# Patient Record
Sex: Female | Born: 1960 | Hispanic: Yes | Marital: Single | Smoking: Never smoker
Health system: Southern US, Community
[De-identification: ages and names within clinical notes are randomized; demographics above are authoritative.]

## PROBLEM LIST (undated history)

## (undated) DIAGNOSIS — E119 Type 2 diabetes mellitus without complications: Secondary | ICD-10-CM

## (undated) DIAGNOSIS — E785 Hyperlipidemia, unspecified: Secondary | ICD-10-CM

## (undated) HISTORY — DX: Hyperlipidemia, unspecified: E78.5

## (undated) HISTORY — DX: Type 2 diabetes mellitus without complications: E11.9

## (undated) HISTORY — PX: CHOLECYSTECTOMY: SHX55

---

## 2013-10-07 HISTORY — PX: ABDOMINAL HYSTERECTOMY: SHX81

## 2020-04-12 ENCOUNTER — Other Ambulatory Visit: Payer: Self-pay

## 2020-04-12 ENCOUNTER — Ambulatory Visit (INDEPENDENT_AMBULATORY_CARE_PROVIDER_SITE_OTHER): Payer: Self-pay | Admitting: Cardiology

## 2020-04-12 ENCOUNTER — Encounter: Payer: Self-pay | Admitting: Cardiology

## 2020-04-12 VITALS — BP 108/60 | HR 64 | Ht 59.0 in | Wt 108.8 lb

## 2020-04-12 DIAGNOSIS — R42 Dizziness and giddiness: Secondary | ICD-10-CM

## 2020-04-12 DIAGNOSIS — E0800 Diabetes mellitus due to underlying condition with hyperosmolarity without nonketotic hyperglycemic-hyperosmolar coma (NKHHC): Secondary | ICD-10-CM

## 2020-04-12 DIAGNOSIS — E785 Hyperlipidemia, unspecified: Secondary | ICD-10-CM

## 2020-04-12 NOTE — Patient Instructions (Signed)
Medication Instructions:  Your physician recommends that you continue on your current medications as directed. Please refer to the Current Medication list given to you today.  *If you need a refill on your cardiac medications before your next appointment, please call your pharmacy*   Lab Work: Your physician recommends that you return for lab work in: This week or next week. You must be fasting for these labs Lipds If you have labs (blood work) drawn today and your tests are completely normal, you will receive your results only by: Marland Kitchen MyChart Message (if you have MyChart) OR . A paper copy in the mail If you have any lab test that is abnormal or we need to change your treatment, we will call you to review the results.   Testing/Procedures: None   Follow-Up: At [facility], you and your health needs are our priority.  As part of our continuing mission to provide you with exceptional heart care, we have created designated Provider Care Teams.  These Care Teams include your primary Cardiologist (physician) and Advanced Practice Providers (APPs -  Physician Assistants and Nurse Practitioners) who all work together to provide you with the care you need, when you need it.  We recommend signing up for the patient portal called "MyChart".  Sign up information is provided on this After Visit Summary.  MyChart is used to connect with patients for Virtual Visits (Telemedicine).  Patients are able to view lab/test results, encounter notes, upcoming appointments, etc.  Non-urgent messages can be sent to your provider as well.   To learn more about what you can do with MyChart, go to [email or web address].    Your next appointment:   6 month(s)  The format for your next appointment:   In Person  Provider:   Thomasene Ripple, DO   Other Instructions

## 2020-04-12 NOTE — Progress Notes (Signed)
Cardiology Office Note:    Date:  04/12/2020   ID:  Lynn Phillips, DOB 1961/06/07, MRN [id number]  PCP:  [place], Sarah T, MD  Cardiologist:  Berniece Salines, DO  Electrophysiologist:  None   Referring MD: [place], Sarah T, MD   " I am doing good"  History of Present Illness:    Lynn Phillips is a 59 y.o. female with a hx of diabetes mellitus type 2 on Metformin, hyperlipidemia with most recent LDL back in May showing greater than 190 she was started on atorvastatin 80 mg and Zetia 10 mg.  After the start of her medication she did not seem to show any improvement in her lipid profile therefore it was recommended that she see cardiology.  In the meantime the patient reports that she has been taking her medication as prescribed.  She also tells me that transiently she has some dizziness which has resolved.  Denies any chest pain, shortness of breath, nausea, vomiting.  Of note the patient speaks primarily Spanish therefore our visit was with a [place] spanish interpreter Lynn Phillips).   Past Medical History:  Diagnosis Date  . Diabetes mellitus without complication ([place])   . Hyperlipidemia     History reviewed. No pertinent surgical history.  Current Medications: Current Meds  Medication Sig  . albuterol (VENTOLIN HFA) 108 (90 Base) MCG/ACT inhaler Inhale 2 puffs into the lungs every 6 (six) hours as needed for wheezing or shortness of breath.  Marland Kitchen atorvastatin (LIPITOR) 80 MG tablet Take 80 mg by mouth daily.  Marland Kitchen ezetimibe (ZETIA) 10 MG tablet Take 10 mg by mouth daily.  . metFORMIN (GLUCOPHAGE) 500 MG tablet Take 500 mg by mouth daily.  . polyethylene glycol powder (GLYCOLAX/MIRALAX) 17 GM/SCOOP powder Take 1 capsule by mouth daily. Take 1 cap mixed in water     Allergies:   Patient has no allergy information on record.   Social History   Socioeconomic History  . Marital status: Unknown    Spouse name: Not on file  . Number of children: Not on file  . Years  of education: Not on file  . Highest education level: Not on file  Occupational History  . Not on file  Tobacco Use  . Smoking status: Never Smoker  . Smokeless tobacco: Never Used  Substance and Sexual Activity  . Alcohol use: Not on file  . Drug use: Not on file  . Sexual activity: Not on file  Other Topics Concern  . Not on file  Social History Narrative  . Not on file   Social Determinants of Health   Financial Resource Strain:   . Difficulty of Paying Living Expenses:   Food Insecurity:   . Worried About Charity fundraiser in the Last Year:   . Arboriculturist in the Last Year:   Transportation Needs:   . Film/video editor (Medical):   Marland Kitchen Lack of Transportation (Non-Medical):   Physical Activity:   . Days of Exercise per Week:   . Minutes of Exercise per Session:   Stress:   . Feeling of Stress :   Social Connections:   . Frequency of Communication with Friends and Family:   . Frequency of Social Gatherings with Friends and Family:   . Attends Religious Services:   . Active Member of Clubs or Organizations:   . Attends Archivist Meetings:   Marland Kitchen Marital Status:      Family History: The patient's family history includes Hypertension  in her brother.  ROS:   Review of Systems  Constitution: Negative for decreased appetite, fever and weight gain.  HENT: Negative for congestion, ear discharge, hoarse voice and sore throat.   Eyes: Negative for discharge, redness, vision loss in right eye and visual halos.  Cardiovascular: Negative for chest pain, dyspnea on exertion, leg swelling, orthopnea and palpitations.  Respiratory: Negative for cough, hemoptysis, shortness of breath and snoring.   Endocrine: Negative for heat intolerance and polyphagia.  Hematologic/Lymphatic: Negative for bleeding problem. Does not bruise/bleed easily.  Skin: Negative for flushing, nail changes, rash and suspicious lesions.  Musculoskeletal: Negative for arthritis, joint pain,  muscle cramps, myalgias, neck pain and stiffness.  Gastrointestinal: Negative for abdominal pain, bowel incontinence, diarrhea and excessive appetite.  Genitourinary: Negative for decreased libido, genital sores and incomplete emptying.  Neurological: Negative for brief paralysis, focal weakness, headaches and loss of balance.  Psychiatric/Behavioral: Negative for altered mental status, depression and suicidal ideas.  Allergic/Immunologic: Negative for HIV exposure and persistent infections.    EKGs/Labs/Other Studies Reviewed:    The following studies were reviewed today:   EKG:  The ekg ordered today demonstrates sinus rhythm, heart rate 64 bpm.   Recent Labs: Done on November 19, 2019 CBC: WBC 7.2, hemoglobin 13.4, hematocrit 41.7, platelet 321 Chemistry: Glucose 114, BUN 11, creatinine 0.64, sodium 144, potassium 4.2, chloride 104, bicarb 25, calcium 9.7, total protein 7.0, albumin 4.3, total globulin 2.7, total bili 0.5, alk phos 106, AST 15, ALT 17  Recent Lipid Panel done on November 19, 2019 Total cholesterol 283, triglyceride 151, HDL 64, LDL 191   Physical Exam:    VS:  BP 108/60   Pulse 64   Ht 4' 11" (1.499 m)   Wt 108 lb 12.8 oz (49.4 kg)   SpO2 96%   BMI 21.97 kg/m     Wt Readings from Last 3 Encounters:  04/12/20 108 lb 12.8 oz (49.4 kg)     GEN: Well nourished, well developed in no acute distress HEENT: Normal NECK: No JVD; No carotid bruits LYMPHATICS: No lymphadenopathy CARDIAC: S1S2 noted,RRR, no murmurs, rubs, gallops RESPIRATORY:  Clear to auscultation without rales, wheezing or rhonchi  ABDOMEN: Soft, non-tender, non-distended, +bowel sounds, no guarding. EXTREMITIES: No edema, No cyanosis, no clubbing MUSCULOSKELETAL:  No deformity  SKIN: Warm and dry NEUROLOGIC:  Alert and oriented x 3, non-focal PSYCHIATRIC:  Normal affect, good insight  ASSESSMENT:    1. Hyperlipidemia, unspecified hyperlipidemia type   2. Dizziness   3. Diabetes  mellitus due to underlying condition with hyperosmolarity without coma, without long-term current use of insulin (HCC)    PLAN:     I was able to review the patient most recent lab which was on November 19, 2019 which showed LDL of 191, total cholesterol 283, triglyceride 151 HDL 64.  She has been on atorvastatin 80 mg daily along with Zetia 10 mg daily.  At this time would not like to do is repeat a fasting lipid profile with LP(a).  I do think she is going to be a good candidate for PCSK9 inhibitors, but first I like to see her repeat labs.  If she has not improved she will be referred to our lipid clinic for the transition to PCSK9 inhibitors.  She tells me that her dizziness has significantly improved and she has not had any episodes of dizziness over the last 2 months.  Diabetes mellitus she is on Metformin per PCP we will continue this medications.  No signs  or symptoms of angina therefore there is no need to proceed any ischemic evaluation at this time.  All of her questions were answered during this visit with our provided interpreter-she and her daughter did not have any further questions.  The patient is in agreement with the above plan. The patient left the office in stable condition.  The patient will follow up in 6 months or sooner if needed.   Medication Adjustments/Labs and Tests Ordered: Current medicines are reviewed at length with the patient today.  Concerns regarding medicines are outlined above.  Orders Placed This Encounter  Procedures  . Lipid Profile  . Lipoprotein A (LPA)  . EKG 12-Lead   No orders of the defined types were placed in this encounter.   Patient Instructions  Medication Instructions:  Your physician recommends that you continue on your current medications as directed. Please refer to the Current Medication list given to you today.  *If you need a refill on your cardiac medications before your next appointment, please call your pharmacy*   Lab  Work: Your physician recommends that you return for lab work in: This week or next week. You must be fasting for these labs Lipds If you have labs (blood work) drawn today and your tests are completely normal, you will receive your results only by: Marland Kitchen MyChart Message (if you have MyChart) OR . A paper copy in the mail If you have any lab test that is abnormal or we need to change your treatment, we will call you to review the results.   Testing/Procedures: None   Follow-Up: At [facility], you and your health needs are our priority.  As part of our continuing mission to provide you with exceptional heart care, we have created designated Provider Care Teams.  These Care Teams include your primary Cardiologist (physician) and Advanced Practice Providers (APPs -  Physician Assistants and Nurse Practitioners) who all work together to provide you with the care you need, when you need it.  We recommend signing up for the patient portal called "MyChart".  Sign up information is provided on this After Visit Summary.  MyChart is used to connect with patients for Virtual Visits (Telemedicine).  Patients are able to view lab/test results, encounter notes, upcoming appointments, etc.  Non-urgent messages can be sent to your provider as well.   To learn more about what you can do with MyChart, go to [email or web address].    Your next appointment:   6 month(s)  The format for your next appointment:   In Person  Provider:   Berniece Salines, DO   Other Instructions      Adopting a Healthy Lifestyle.  Know what a healthy weight is for you (roughly BMI <25) and aim to maintain this   Aim for 7+ servings of fruits and vegetables daily   65-80+ fluid ounces of water or unsweet tea for healthy kidneys   Limit to max 1 drink of alcohol per day; avoid smoking/tobacco   Limit animal fats in diet for cholesterol and heart health - choose grass fed whenever available   Avoid highly processed  foods, and foods high in saturated/trans fats   Aim for low stress - take time to unwind and care for your mental health   Aim for 150 min of moderate intensity exercise weekly for heart health, and weights twice weekly for bone health   Aim for 7-9 hours of sleep daily   When it comes to diets, agreement about the perfect plan isnt  easy to find, even among the experts. Experts at the [employer] developed an idea known as the Healthy Eating Plate. Just imagine a plate divided into logical, healthy portions.   The emphasis is on diet quality:   Load up on vegetables and fruits - one-half of your plate: Aim for color and variety, and remember that potatoes dont count.   Go for whole grains - one-quarter of your plate: Whole wheat, barley, wheat berries, quinoa, oats, brown rice, and foods made with them. If you want pasta, go with whole wheat pasta.   Protein power - one-quarter of your plate: Fish, chicken, beans, and nuts are all healthy, versatile protein sources. Limit red meat.   The diet, however, does go beyond the plate, offering a few other suggestions.   Use healthy plant oils, such as olive, canola, soy, corn, sunflower and peanut. Check the labels, and avoid partially hydrogenated oil, which have unhealthy trans fats.   If youre thirsty, drink water. Coffee and tea are good in moderation, but skip sugary drinks and limit milk and dairy products to one or two daily servings.   The type of carbohydrate in the diet is more important than the amount. Some sources of carbohydrates, such as vegetables, fruits, whole grains, and beans-are healthier than others.   Finally, stay active  Signed, Berniece Salines, DO  04/12/2020 9:05 AM    [place]

## 2020-04-15 LAB — LIPID PANEL
Chol/HDL Ratio: 4.7 ratio — ABNORMAL HIGH (ref 0.0–4.4)
Cholesterol, Total: 291 mg/dL — ABNORMAL HIGH (ref 100–199)
HDL: 62 mg/dL (ref >39)
LDL Chol Calc (NIH): 201 mg/dL — ABNORMAL HIGH (ref 0–99)
Triglycerides: 152 mg/dL — ABNORMAL HIGH (ref 0–149)
VLDL Cholesterol Cal: 28 mg/dL (ref 5–40)

## 2020-04-15 LAB — LIPOPROTEIN A (LPA): Lipoprotein (a): 193.9 nmol/L — ABNORMAL HIGH (ref <75.0)

## 2020-04-17 ENCOUNTER — Telehealth: Payer: Self-pay

## 2020-04-17 DIAGNOSIS — E785 Hyperlipidemia, unspecified: Secondary | ICD-10-CM

## 2020-04-17 NOTE — Telephone Encounter (Signed)
-----   Message from Thomasene Ripple, DO sent at 04/17/2020 10:07 AM EDT ----- Please let the patient know that her LDL is still highly elevated, LP(a) is 193 which is significantly high.  Please refer the patient to our lipid clinic.  She is a great candidate to be started on PCSK9 inhibitors.

## 2020-04-17 NOTE — Telephone Encounter (Signed)
Spoke with patient regarding results and recommendation. Interpreter used with ID# [id number]  Patient verbalizes understanding and is agreeable to plan of care. Advised patient to call back with any issues or concerns.

## 2020-04-26 ENCOUNTER — Encounter: Payer: Self-pay | Admitting: General Practice

## 2020-10-03 ENCOUNTER — Other Ambulatory Visit: Payer: Self-pay | Admitting: Family Medicine

## 2020-10-03 DIAGNOSIS — Z1231 Encounter for screening mammogram for malignant neoplasm of breast: Secondary | ICD-10-CM

## 2020-11-10 ENCOUNTER — Ambulatory Visit
Admission: RE | Admit: 2020-11-10 | Discharge: 2020-11-10 | Disposition: A | Discharge status: Home / Self Care | Payer: No Typology Code available for payment source | Source: Ambulatory Visit | Attending: Family Medicine | Admitting: Family Medicine

## 2020-11-10 ENCOUNTER — Other Ambulatory Visit: Payer: Self-pay

## 2020-11-10 DIAGNOSIS — Z1231 Encounter for screening mammogram for malignant neoplasm of breast: Secondary | ICD-10-CM

## 2021-09-07 ENCOUNTER — Ambulatory Visit: Payer: No Typology Code available for payment source | Admitting: Cardiology

## 2021-09-17 ENCOUNTER — Ambulatory Visit (INDEPENDENT_AMBULATORY_CARE_PROVIDER_SITE_OTHER): Payer: Self-pay | Admitting: Cardiology

## 2021-09-17 ENCOUNTER — Other Ambulatory Visit: Payer: Self-pay

## 2021-09-17 ENCOUNTER — Encounter: Payer: Self-pay | Admitting: Cardiology

## 2021-09-17 VITALS — BP 104/54 | HR 63 | Ht 59.0 in | Wt 112.6 lb

## 2021-09-17 DIAGNOSIS — Z09 Encounter for follow-up examination after completed treatment for conditions other than malignant neoplasm: Secondary | ICD-10-CM

## 2021-09-17 DIAGNOSIS — E7849 Other hyperlipidemia: Secondary | ICD-10-CM

## 2021-09-17 DIAGNOSIS — E785 Hyperlipidemia, unspecified: Secondary | ICD-10-CM

## 2021-09-17 DIAGNOSIS — Z719 Counseling, unspecified: Secondary | ICD-10-CM

## 2021-09-17 NOTE — Progress Notes (Signed)
Cardiology Office Note:    Date:  09/20/2021   ID:  Lynn Phillips, DOB 09/04/1961, MRN [id number]  PCP:  [place], Sarah T, MD  Cardiologist:  Thomasene Ripple, DO  Electrophysiologist:  None   Referring MD: [place], Sarah T, MD   " I am doing fine"   History of Present Illness:    Lynn Phillips is a 60 y.o. female with a hx of of diabetes mellitus type 2 on Metformin, hyperlipidemia with most recent LDL back in May showing greater than 190 she was started on atorvastatin 80 mg and Zetia 10 mg.  After the start of her medication she did not seem to show any improvement in her lipid profile therefore it was recommended that she see cardiology.  I then saw the patient on April 12, 2020 at that time I kept her on her medication, did repeat labs. Her labs came back elevated so I recommended the patient for PCSK9 inhibitors and refer her to our lipid clinic.  Appears that the patient did not make it to the lipid clinic to continue to take her Lipitor and Zetia.  Is here today for follow-up visit she offers no complaints at this time.  Past Medical History:  Diagnosis Date   Diabetes mellitus without complication (HCC)    Hyperlipidemia     No past surgical history on file.  Current Medications: Current Meds  Medication Sig   atorvastatin (LIPITOR) 80 MG tablet Take 80 mg by mouth daily.   ezetimibe (ZETIA) 10 MG tablet Take 10 mg by mouth daily.   metFORMIN (GLUCOPHAGE) 500 MG tablet Take 500 mg by mouth daily.     Allergies:   Patient has no known allergies.   Social History   Socioeconomic History   Marital status: Unknown    Spouse name: Not on file   Number of children: Not on file   Years of education: Not on file   Highest education level: Not on file  Occupational History   Not on file  Tobacco Use   Smoking status: Never   Smokeless tobacco: Never  Substance and Sexual Activity   Alcohol use: Not on file   Drug use: Not on file   Sexual activity:  Not on file  Other Topics Concern   Not on file  Social History Narrative   Not on file   Social Determinants of Health   Financial Resource Strain: Not on file  Food Insecurity: Not on file  Transportation Needs: Not on file  Physical Activity: Not on file  Stress: Not on file  Social Connections: Not on file     Family History: The patient's family history includes Hypertension in her brother.  ROS:   Review of Systems  Constitution: Negative for decreased appetite, fever and weight gain.  HENT: Negative for congestion, ear discharge, hoarse voice and sore throat.   Eyes: Negative for discharge, redness, vision loss in right eye and visual halos.  Cardiovascular: Negative for chest pain, dyspnea on exertion, leg swelling, orthopnea and palpitations.  Respiratory: Negative for cough, hemoptysis, shortness of breath and snoring.   Endocrine: Negative for heat intolerance and polyphagia.  Hematologic/Lymphatic: Negative for bleeding problem. Does not bruise/bleed easily.  Skin: Negative for flushing, nail changes, rash and suspicious lesions.  Musculoskeletal: Negative for arthritis, joint pain, muscle cramps, myalgias, neck pain and stiffness.  Gastrointestinal: Negative for abdominal pain, bowel incontinence, diarrhea and excessive appetite.  Genitourinary: Negative for decreased libido, genital sores and incomplete emptying.  Neurological: Negative for brief paralysis, focal weakness, headaches and loss of balance.  Psychiatric/Behavioral: Negative for altered mental status, depression and suicidal ideas.  Allergic/Immunologic: Negative for HIV exposure and persistent infections.    EKGs/Labs/Other Studies Reviewed:    The following studies were reviewed today:   EKG: None today  Recent Labs: No results found for requested labs within last 8760 hours.  Recent Lipid Panel    Component Value Date/Time   CHOL 291 (H) 04/13/2020 0909   TRIG 152 (H) 04/13/2020 0909    HDL 62 04/13/2020 0909   CHOLHDL 4.7 (H) 04/13/2020 0909   LDLCALC 201 (H) 04/13/2020 0909    Physical Exam:    VS:  BP (!) 104/54   Pulse 63   Ht 4\' 11"  (1.499 m)   Wt 112 lb 9.6 oz (51.1 kg)   SpO2 97%   BMI 22.74 kg/m     Wt Readings from Last 3 Encounters:  09/17/21 112 lb 9.6 oz (51.1 kg)  04/12/20 108 lb 12.8 oz (49.4 kg)     GEN: Well nourished, well developed in no acute distress HEENT: Normal NECK: No JVD; No carotid bruits LYMPHATICS: No lymphadenopathy CARDIAC: S1S2 noted,RRR, no murmurs, rubs, gallops RESPIRATORY:  Clear to auscultation without rales, wheezing or rhonchi  ABDOMEN: Soft, non-tender, non-distended, +bowel sounds, no guarding. EXTREMITIES: No edema, No cyanosis, no clubbing MUSCULOSKELETAL:  No deformity  SKIN: Warm and dry NEUROLOGIC:  Alert and oriented x 3, non-focal PSYCHIATRIC:  Normal affect, good insight  ASSESSMENT:    1. Familial hyperlipidemia   2. Hyperlipidemia, unspecified hyperlipidemia type   3. Cardiology follow-up encounter   4. Health education/counseling    PLAN:     1.  We will get repeat labs for her lipid profile and LP(a).  I still would like to have the patient see my colleagues in the lipid clinic if this is still elevated.  She is in agreement with this.  Her visit was facilitated by an interpreter.  CV risk counseling and primary prevention:  -recommend heart healthy/Mediterranean diet, with whole grains, fruits, vegetable, fish, lean meats, nuts, and olive oil. Limit salt. -recommend moderate walking, 3-5 times/week for 30-50 minutes each session. Aim for at least 150 minutes.week. Goal should be pace of 3 miles/hours, or walking 1.5 miles in 30 minutes -recommend avoidance of tobacco products. Avoid excess alcohol.  The patient is in agreement with the above plan. The patient left the office in stable condition.  The patient will follow up in   Medication Adjustments/Labs and Tests Ordered: Current medicines  are reviewed at length with the patient today.  Concerns regarding medicines are outlined above.  Orders Placed This Encounter  Procedures   Lipid panel   Lipoprotein A (LPA)   EKG 12-Lead   No orders of the defined types were placed in this encounter.   Patient Instructions  Medication Instructions:  Your physician recommends that you continue on your current medications as directed. Please refer to the Current Medication list given to you today.  *If you need a refill on your cardiac medications before your next appointment, please call your pharmacy*   Lab Work: Your physician recommends that you return for lab work in:  Lipids, Lp(a) If you have labs (blood work) drawn today and your tests are completely normal, you will receive your results only by: MyChart Message (if you have MyChart) OR A paper copy in the mail If you have any lab test that is abnormal or we need  to change your treatment, we will call you to review the results.   Testing/Procedures: None   Follow-Up: At [facility], you and your health needs are our priority.  As part of our continuing mission to provide you with exceptional heart care, we have created designated Provider Care Teams.  These Care Teams include your primary Cardiologist (physician) and Advanced Practice Providers (APPs -  Physician Assistants and Nurse Practitioners) who all work together to provide you with the care you need, when you need it.  We recommend signing up for the patient portal called "MyChart".  Sign up information is provided on this After Visit Summary.  MyChart is used to connect with patients for Virtual Visits (Telemedicine).  Patients are able to view lab/test results, encounter notes, upcoming appointments, etc.  Non-urgent messages can be sent to your provider as well.   To learn more about what you can do with MyChart, go to [email or web address].    Your next appointment:   1 year(s)  The format for your next  appointment:   In Person  Provider:   Thomasene Ripple, DO     Other Instructions     Adopting a Healthy Lifestyle.  Know what a healthy weight is for you (roughly BMI <25) and aim to maintain this   Aim for 7+ servings of fruits and vegetables daily   65-80+ fluid ounces of water or unsweet tea for healthy kidneys   Limit to max 1 drink of alcohol per day; avoid smoking/tobacco   Limit animal fats in diet for cholesterol and heart health - choose grass fed whenever available   Avoid highly processed foods, and foods high in saturated/trans fats   Aim for low stress - take time to unwind and care for your mental health   Aim for 150 min of moderate intensity exercise weekly for heart health, and weights twice weekly for bone health   Aim for 7-9 hours of sleep daily   When it comes to diets, agreement about the perfect plan isnt easy to find, even among the experts. Experts at the [facility] of [employer] developed an idea known as the Healthy Eating Plate. Just imagine a plate divided into logical, healthy portions.   The emphasis is on diet quality:   Load up on vegetables and fruits - one-half of your plate: Aim for color and variety, and remember that potatoes dont count.   Go for whole grains - one-quarter of your plate: Whole wheat, barley, wheat berries, quinoa, oats, brown rice, and foods made with them. If you want pasta, go with whole wheat pasta.   Protein power - one-quarter of your plate: Fish, chicken, beans, and nuts are all healthy, versatile protein sources. Limit red meat.   The diet, however, does go beyond the plate, offering a few other suggestions.   Use healthy plant oils, such as olive, canola, soy, corn, sunflower and peanut. Check the labels, and avoid partially hydrogenated oil, which have unhealthy trans fats.   If youre thirsty, drink water. Coffee and tea are good in moderation, but skip sugary drinks and limit milk and dairy products to  one or two daily servings.   The type of carbohydrate in the diet is more important than the amount. Some sources of carbohydrates, such as vegetables, fruits, whole grains, and beans-are healthier than others.   Finally, stay active  Signed, Thomasene Ripple, DO  09/20/2021 9:21 PM    [facility]

## 2021-09-17 NOTE — Patient Instructions (Signed)
Medication Instructions:  Your physician recommends that you continue on your current medications as directed. Please refer to the Current Medication list given to you today.  *If you need a refill on your cardiac medications before your next appointment, please call your pharmacy*   Lab Work: Your physician recommends that you return for lab work in:  Lipids, Lp(a) If you have labs (blood work) drawn today and your tests are completely normal, you will receive your results only by: MyChart Message (if you have MyChart) OR A paper copy in the mail If you have any lab test that is abnormal or we need to change your treatment, we will call you to review the results.   Testing/Procedures: None   Follow-Up: At [facility], you and your health needs are our priority.  As part of our continuing mission to provide you with exceptional heart care, we have created designated Provider Care Teams.  These Care Teams include your primary Cardiologist (physician) and Advanced Practice Providers (APPs -  Physician Assistants and Nurse Practitioners) who all work together to provide you with the care you need, when you need it.  We recommend signing up for the patient portal called "MyChart".  Sign up information is provided on this After Visit Summary.  MyChart is used to connect with patients for Virtual Visits (Telemedicine).  Patients are able to view lab/test results, encounter notes, upcoming appointments, etc.  Non-urgent messages can be sent to your provider as well.   To learn more about what you can do with MyChart, go to [email or web address].    Your next appointment:   1 year(s)  The format for your next appointment:   In Person  Provider:   Thomasene Ripple, DO     Other Instructions

## 2021-09-20 DIAGNOSIS — E7849 Other hyperlipidemia: Secondary | ICD-10-CM | POA: Insufficient documentation

## 2021-09-20 DIAGNOSIS — E785 Hyperlipidemia, unspecified: Secondary | ICD-10-CM | POA: Insufficient documentation

## 2021-09-20 DIAGNOSIS — Z719 Counseling, unspecified: Secondary | ICD-10-CM | POA: Insufficient documentation

## 2021-09-20 DIAGNOSIS — Z09 Encounter for follow-up examination after completed treatment for conditions other than malignant neoplasm: Secondary | ICD-10-CM | POA: Insufficient documentation

## 2021-12-21 ENCOUNTER — Other Ambulatory Visit: Payer: Self-pay | Admitting: Family Medicine

## 2021-12-21 DIAGNOSIS — Z1231 Encounter for screening mammogram for malignant neoplasm of breast: Secondary | ICD-10-CM

## 2021-12-26 ENCOUNTER — Other Ambulatory Visit: Payer: Self-pay

## 2021-12-26 DIAGNOSIS — Z1231 Encounter for screening mammogram for malignant neoplasm of breast: Secondary | ICD-10-CM

## 2022-02-25 ENCOUNTER — Ambulatory Visit
Admission: RE | Admit: 2022-02-25 | Discharge: 2022-02-25 | Disposition: A | Discharge status: Home / Self Care | Payer: No Typology Code available for payment source | Source: Ambulatory Visit | Attending: Family Medicine | Admitting: Family Medicine

## 2022-02-25 DIAGNOSIS — Z1231 Encounter for screening mammogram for malignant neoplasm of breast: Secondary | ICD-10-CM

## 2023-04-28 IMAGING — MG MM DIGITAL SCREENING BILAT W/ TOMO AND CAD
8 series · 8 of 24 positions shown · non-contrast
Comparison: Previous exam(s).

CLINICAL DATA: Screening.

EXAM:
DIGITAL SCREENING BILATERAL MAMMOGRAM WITH TOMOSYNTHESIS AND CAD
TECHNIQUE: Bilateral screening digital craniocaudal and mediolateral oblique
mammograms were obtained. Bilateral screening digital breast
tomosynthesis was performed. The images were evaluated with
computer-aided detection.

[L MLO synth-2D]
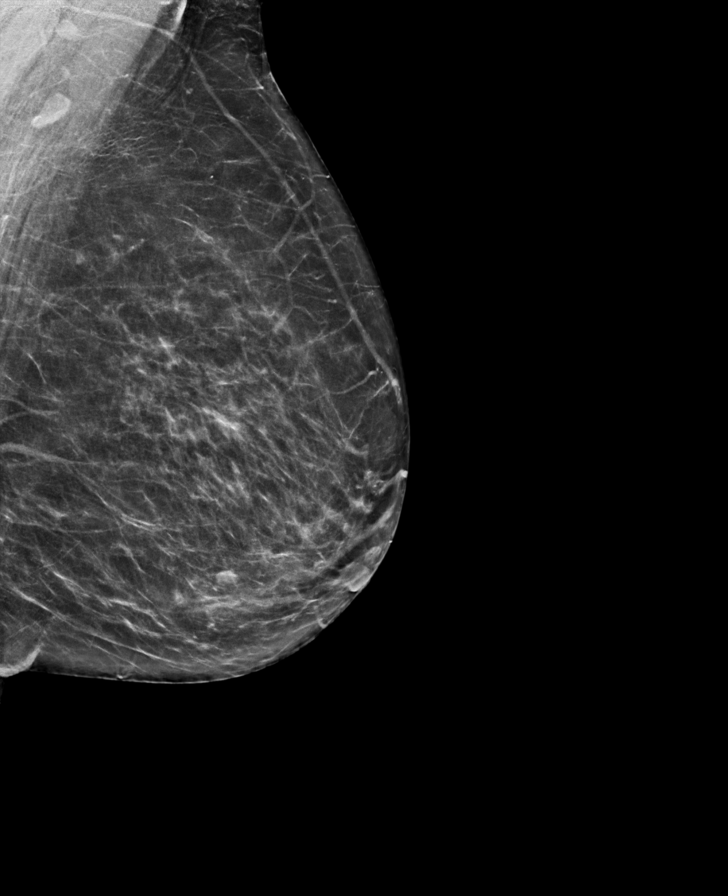

[L CC synth-2D]
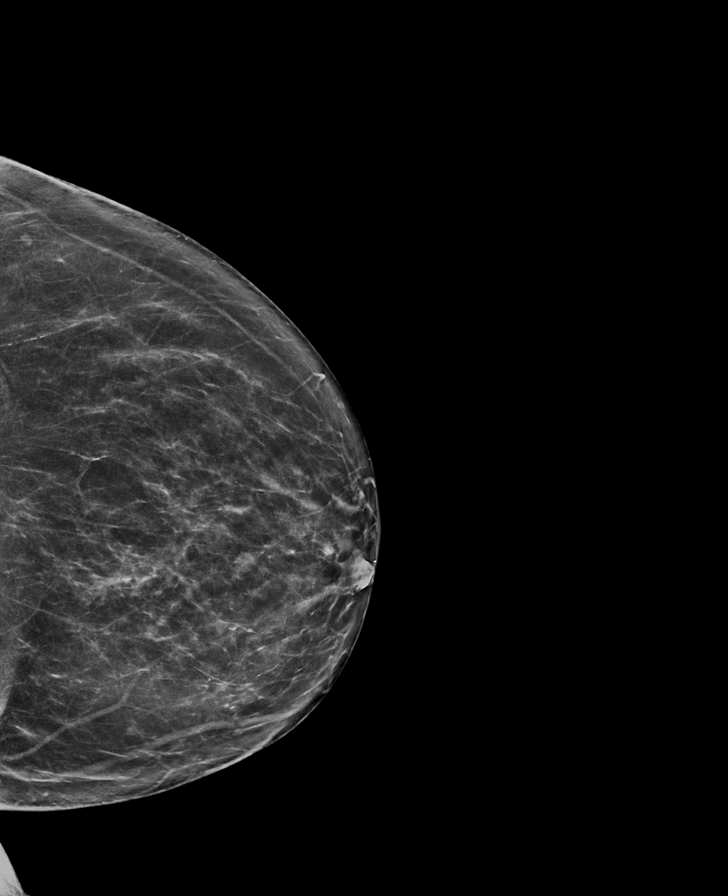

[R MLO synth-2D]
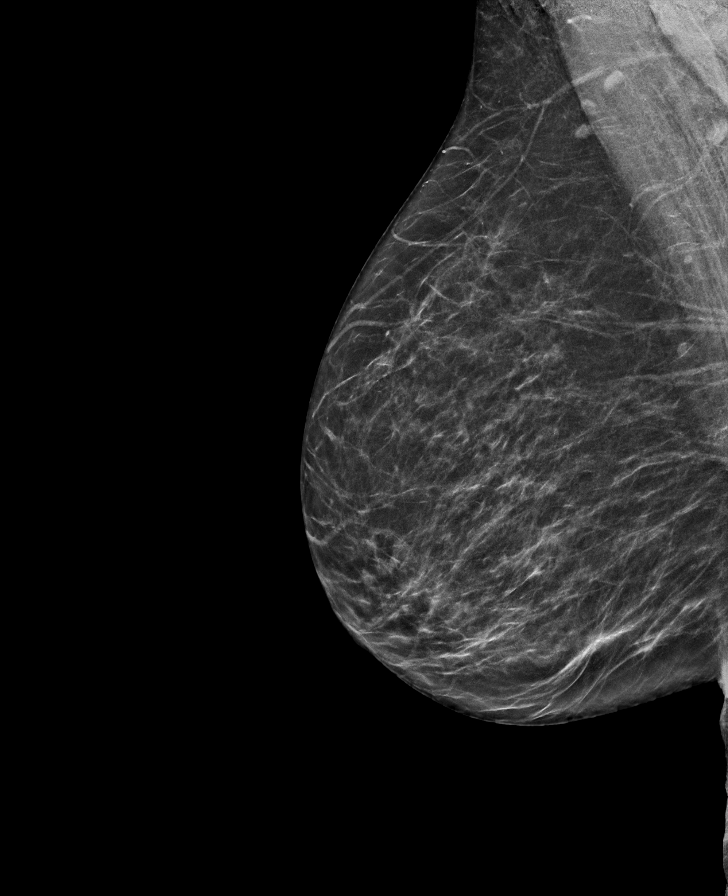

[R CC synth-2D]
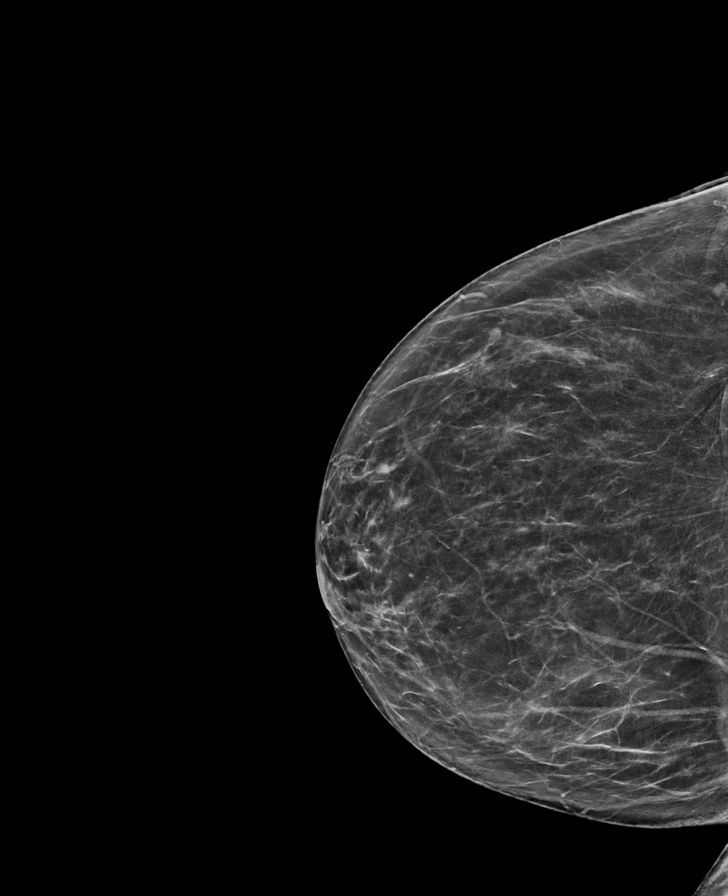

[L MLO tomo · tomo slice 37/74.0]
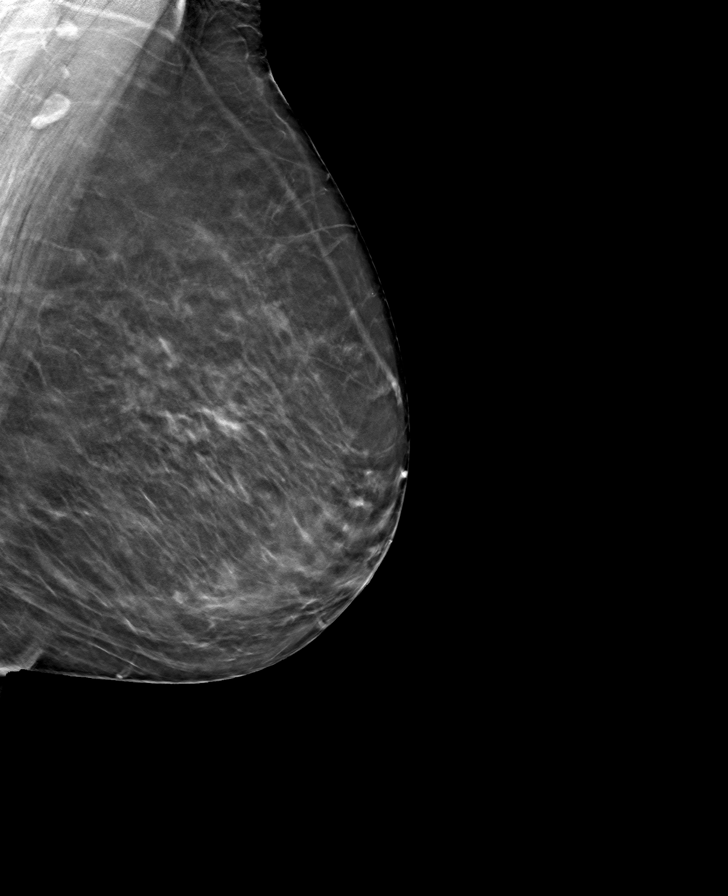

[L CC tomo · tomo slice 37/73.0]
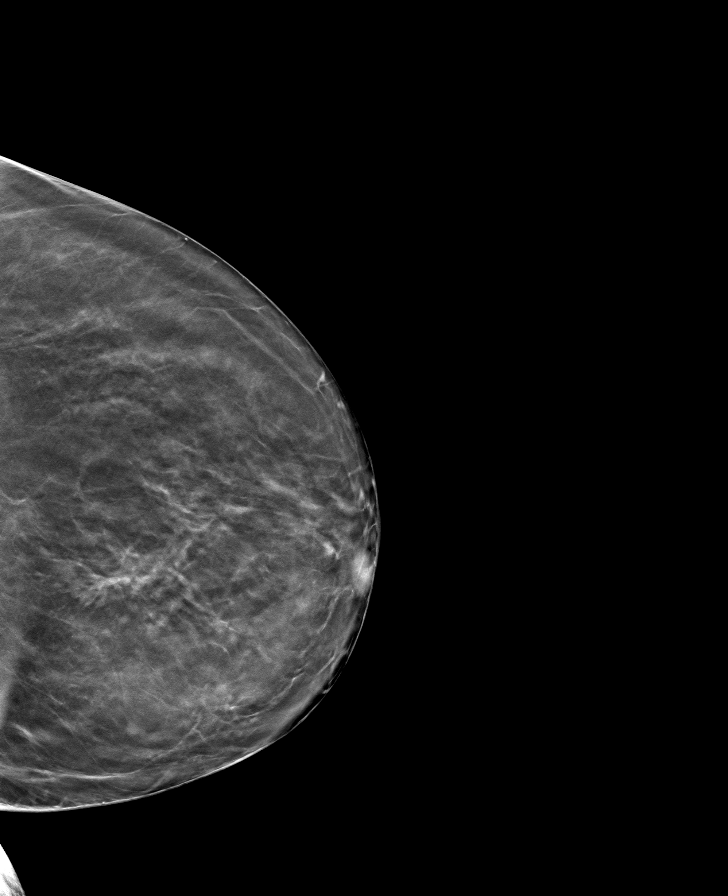

[R CC tomo · tomo slice 35/70.0]
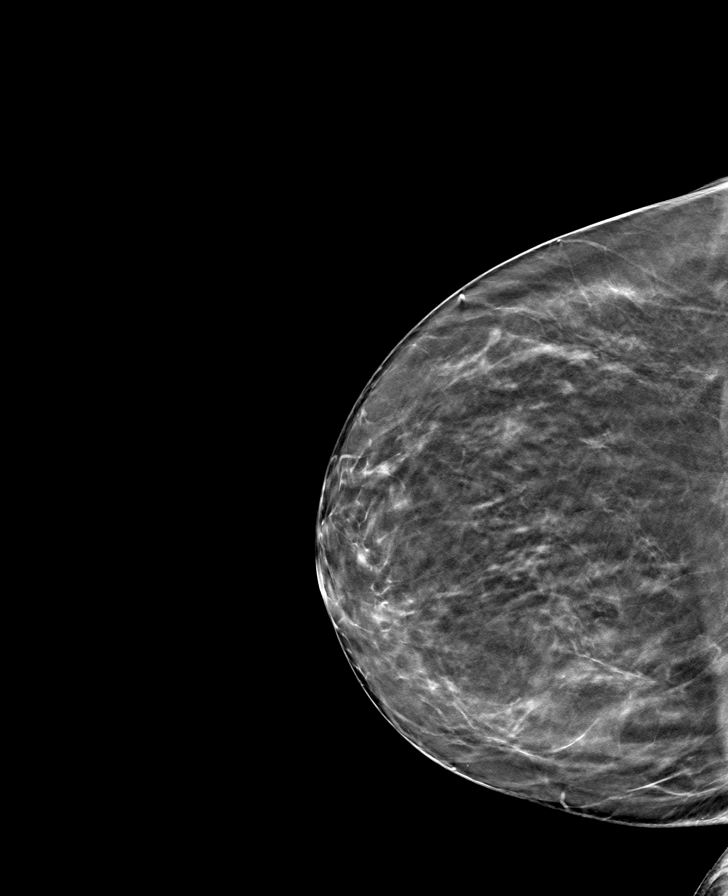

[R MLO tomo · tomo slice 35/70.0]
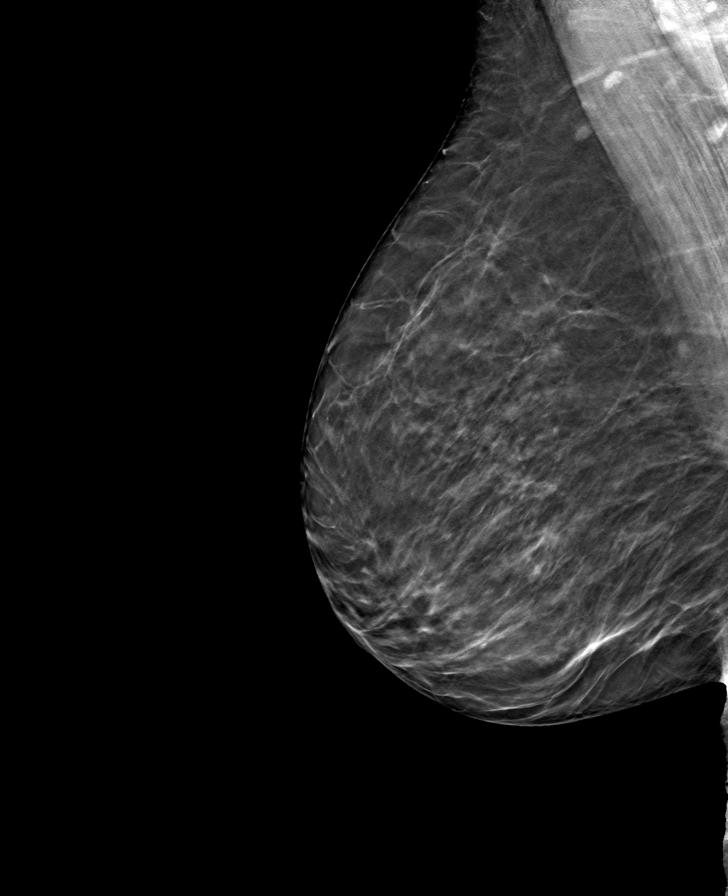

[8 of 24 positions shown; findings below may reference images not displayed]

ACR Breast Density Category b: There are scattered areas of
fibroglandular density.
FINDINGS: There are no findings suspicious for malignancy.
IMPRESSION: No mammographic evidence of malignancy. A result letter of this
screening mammogram will be mailed directly to the patient.

RECOMMENDATION:
Screening mammogram in one year. (Code:51-O-LD2)

BI-RADS CATEGORY  1: Negative.

## 2023-05-09 ENCOUNTER — Encounter: Payer: Self-pay | Admitting: Nurse Practitioner

## 2023-07-24 ENCOUNTER — Other Ambulatory Visit (INDEPENDENT_AMBULATORY_CARE_PROVIDER_SITE_OTHER): Payer: Self-pay

## 2023-07-24 ENCOUNTER — Encounter: Payer: Self-pay | Admitting: Nurse Practitioner

## 2023-07-24 ENCOUNTER — Ambulatory Visit (INDEPENDENT_AMBULATORY_CARE_PROVIDER_SITE_OTHER): Payer: Self-pay | Admitting: Nurse Practitioner

## 2023-07-24 VITALS — BP 104/50 | HR 80 | Ht <= 58 in | Wt 91.1 lb

## 2023-07-24 DIAGNOSIS — Z1211 Encounter for screening for malignant neoplasm of colon: Secondary | ICD-10-CM

## 2023-07-24 DIAGNOSIS — R933 Abnormal findings on diagnostic imaging of other parts of digestive tract: Secondary | ICD-10-CM

## 2023-07-24 DIAGNOSIS — D649 Anemia, unspecified: Secondary | ICD-10-CM

## 2023-07-24 DIAGNOSIS — R7989 Other specified abnormal findings of blood chemistry: Secondary | ICD-10-CM

## 2023-07-24 LAB — CBC WITH DIFFERENTIAL/PLATELET
Basophils Absolute: 0 10*3/uL (ref 0.0–0.1)
Basophils Relative: 0.5 % (ref 0.0–3.0)
Eosinophils Absolute: 0.1 10*3/uL (ref 0.0–0.7)
Eosinophils Relative: 1.3 % (ref 0.0–5.0)
HCT: 40.8 % (ref 36.0–46.0)
Hemoglobin: 12.7 g/dL (ref 12.0–15.0)
Lymphocytes Relative: 44.3 % (ref 12.0–46.0)
Lymphs Abs: 3.2 10*3/uL (ref 0.7–4.0)
MCHC: 31.2 g/dL (ref 30.0–36.0)
MCV: 84.5 fL (ref 78.0–100.0)
Monocytes Absolute: 0.5 10*3/uL (ref 0.1–1.0)
Monocytes Relative: 7.6 % (ref 3.0–12.0)
Neutro Abs: 3.3 10*3/uL (ref 1.4–7.7)
Neutrophils Relative %: 46.3 % (ref 43.0–77.0)
Platelets: 286 10*3/uL (ref 150.0–400.0)
RBC: 4.83 Mil/uL (ref 3.87–5.11)
RDW: 15.3 % (ref 11.5–15.5)
WBC: 7.2 10*3/uL (ref 4.0–10.5)

## 2023-07-24 LAB — HEPATIC FUNCTION PANEL
ALT: 48 U/L — ABNORMAL HIGH (ref 0–35)
AST: 40 U/L — ABNORMAL HIGH (ref 0–37)
Albumin: 4 g/dL (ref 3.5–5.2)
Alkaline Phosphatase: 123 U/L — ABNORMAL HIGH (ref 39–117)
Bilirubin, Direct: 0.1 mg/dL (ref 0.0–0.3)
Total Bilirubin: 0.6 mg/dL (ref 0.2–1.2)
Total Protein: 7.3 g/dL (ref 6.0–8.3)

## 2023-07-24 MED ORDER — NA SULFATE-K SULFATE-MG SULF 17.5-3.13-1.6 GM/177ML PO SOLN: 1.0000 | Freq: Once | ORAL | 0 refills | Status: AC

## 2023-07-24 NOTE — Progress Notes (Addendum)
Brief Narrative 62 y.o. yo female , new to the practice, referred by PCP for enteritis and also for colonoscopy. She has a history of  DM2, HLD, cholecystectomy.  Patient does not speak [place].  Her daughter is here and serves as interpreter  ASSESSMENT    Admission to [facility] in June for epigastric pain ( resolved).   Hospital records not available to me right now.  Patient apparently had a lap cholecystectomy in April 2024. Returned to ED in April with epigastric pain. Told she was constipated and pain did resolve with enemas. However, reportedly  a CT scan showed "enteritis ".     anemia ( per PCP's office note).  Hgb 11.5 in June. Baseline hgb?   No overt GI bleeding.   Elevated LFTs ( June labs).  Alk phos 300, mildly elevated ALT .   Colon cancer screening.   No known FMH of colon cancer. No blood in stool. No bowel changes  See PMH for any additional medical history  ADDENDUM:  I received 3 CT scan reports as below.   In summary, we were awaiting scans due to elevation in liver enzymes and also reported enteritis.   Regarding " enteritis" on 03/13/23 CT scan, this was not reported on follow up scan 03/25/23. Furthermore she has since undergone colonoscopy and the terminal ileum was normal up to 20 cm.    Regarding elevation of liver enzymes in June, that my have been related to cholecystitis as numbers have improved since cholecystectomy. Will discuss with Dr. Myrtie Neither as to whether he wants additional imaging to further characterize the described small liver hypodensities.   Lastly, colon findings on below CT scans were not corroborated by recent colonoscopy   01/26/23 CT scan with contrast done for epigastric pain and elevated LFTs showed mild gallbladder wall thickening with gallbladder wall enhancement.  Possible small bowel wall enhancement involving pelvic bowel wall loops suggesting enteritis.  Scattered subcentimeter hypodensities in the liver too small to  characterize.  These may be cyst or hemangiomas  Patient subsequently underwent a cholecystectomy . Readmitted  June 2024 for epigastric pain.   03/13/23 CT scan with contrast shows scattered low-density lesions in the liver too small to definitively characterize but possibly representing cyst or hemangiomas.  Cholecystectomy.  Mild intrahepatic biliary duct dilation due to reservoir effect.  Common bile duct normal in caliber.  Pancreas unremarkable.  Normal caliber large and small bowel.  Abrupt narrowing of the caliber of the ascending colon with mild wall thickening in this area.  Could be under distention or peristalsis.  Large stool burden in the cecum and fecalization of the terminal ileum suggesting slow transit/partial obstruction.  Mild small bowel wall thickening and hyperenhancement of a loop of small bowel in the right lower quadrant.  The adjacent small bowel is mildly distended with fluid.  Normal appendix.  6 mm RUL nodule, indeterminate  03/25/2023 CT scan with contrast shows no biliary duct dilation.  Hepatic hypodensities stable, too small to characterize.  Stomach is contracted.  Small bowel is normal.  Stable abrupt caliber change in the mid ascending colon with moderate retained stool in the upstream ascending colon and cecum but no more than previously.  Colonic contraction from the caliber change through the hepatic flexure distal transverse segment with underlying colitis not excluded.   PLAN   --Update CBC, obtain ferritin, TIBC --Repeat LFTs.  --Requesting CT scan results which will may help [place] better understand abnormal liver tests and  also " enteritis"  --Will arrange for a screening colonoscopy but patient / daughter understand that depending on CT scan findings and / or today's lab results we may need to add an EGD ( if anemic / iron deficient).    HPI   Chief complaint :  No complaints at present  PCP's last office note is available for review.  Unfortunately I do  not have the CT scan done in June showing concerns for enteritis  " Enteritis"  Per daughter, Alundra had her gallbladder out in April.  She returned to the hospital early June with upper abdominal pain.  Patient told she was constipated and the pain did resolve with enemas.  Apparently CT scan was notable for possible enteritis.  The upper abdominal pain was not associated with any nausea, vomiting or diarrhea.  Her symptoms have totally resolved  Colon cancer screening:  Patient has never had colon cancer screening.  She has no family history of colon cancer as far as she knows.  She reports normal bowel movements.  No blood in stool.  Per daughter patient has had some involuntary weight loss for unclear reasons  Unrelated to this visit  - Labs in June per PCP's  office note remarkable for hemoglobin of 11.6, MCV 83, AST 168, ALT 55, alk phos 247.  Follow-up LFTs 03/21/2023 remarkable for alkaline phosphatase of 300, ALT 69.  LFTs in March 2024 were normal.  HCV antibody negative  Regarding anemia, patient does not take NSAIDs on a regular basis.  She has had no dark stools. I do not know her baseline hgb.   Past Medical History:  Diagnosis Date   Diabetes mellitus without complication (HCC)    Hyperlipidemia    Past Surgical History:  Procedure Laterality Date   ABDOMINAL HYSTERECTOMY  2015   CHOLECYSTECTOMY     Family History  Problem Relation Age of Onset   Cancer Father        type unknown-mets   Hypertension Brother    Diabetes Paternal Aunt    Other Daughter        prediabetes   Social History   Tobacco Use   Smoking status: Never   Smokeless tobacco: Never   Current Outpatient Medications  Medication Sig Dispense Refill   albuterol (VENTOLIN HFA) 108 (90 Base) MCG/ACT inhaler Inhale 2 puffs into the lungs every 6 (six) hours as needed for wheezing or shortness of breath.     atorvastatin (LIPITOR) 80 MG tablet Take 80 mg by mouth daily.     polyethylene glycol powder  (GLYCOLAX/MIRALAX) 17 GM/SCOOP powder Take 1 capsule by mouth daily. Take 1 cap mixed in water     STOOL SOFTENER 100 MG capsule Take 100 mg by mouth daily.     No current facility-administered medications for this visit.   No Known Allergies   Review of Systems: All systems reviewed and negative except where noted in HPI.   Wt Readings from Last 3 Encounters:  07/24/23 91 lb 2 oz (41.3 kg)  09/17/21 112 lb 9.6 oz (51.1 kg)  04/12/20 108 lb 12.8 oz (49.4 kg)    Physical Exam:  Ht 4\' 10"  (1.473 m) Comment: height measured without shoes  Wt 91 lb 2 oz (41.3 kg)   BMI 19.05 kg/m  Constitutional:  Pleasant, generally well appearing female in no acute distress. Psychiatric:  Normal mood and affect. Behavior is normal. EENT: Pupils normal.  Conjunctivae are normal. No scleral icterus. Neck supple.  Cardiovascular: Normal rate,  regular rhythm.  Pulmonary/chest: Effort normal and breath sounds normal. No wheezing, rales or rhonchi. Abdominal: Soft, nondistended, nontender. Bowel sounds active throughout. There are no masses palpable. No hepatomegaly. Neurological: Alert and oriented to person place and time.  Willette Cluster, NP  07/24/2023, 1:51 PM  Cc:  Referring Provider [place], Sarah T, MD

## 2023-07-24 NOTE — Patient Instructions (Signed)
  You have been scheduled for a colonoscopy. Please follow written instructions given to you at your visit today.   Please pick up your prep supplies at the pharmacy within the next 1-3 days.  If you use inhalers (even only as needed), please bring them with you on the day of your procedure.  DO NOT TAKE 7 DAYS PRIOR TO TEST- Trulicity (dulaglutide) Ozempic, Wegovy (semaglutide) Mounjaro (tirzepatide) Bydureon Bcise (exanatide extended release)  DO NOT TAKE 1 DAY PRIOR TO YOUR TEST Rybelsus (semaglutide) Adlyxin (lixisenatide) Victoza (liraglutide) Byetta (exanatide) ___________________________________________________________________________   You have been scheduled for a CT scan of the abdomen and pelvis at [facility], 1st floor Radiology. You are scheduled on 07/31/2023 at 2:30pm . You should arrive 15 minutes prior to your appointment time for registration. Please follow the written instructions below on the day of your exam:   1) Do not eat anything after 4 hours prior to your test.   You may take any medications as prescribed with a small amount of water, if necessary. If you take any of the following medications: METFORMIN, GLUCOPHAGE, GLUCOVANCE, AVANDAMET, RIOMET, FORTAMET, ACTOPLUS MET, JANUMET, GLUMETZA or METAGLIP, you MAY be asked to HOLD this medication 48 hours AFTER the exam.   The purpose of you drinking the oral contrast is to aid in the visualization of your intestinal tract. The contrast solution may cause some diarrhea. Depending on your individual set of symptoms, you may also receive an intravenous injection of x-ray contrast/dye. Plan on being at [facility] for 45 minutes or longer, depending on the type of exam you are having performed.   If you have any questions regarding your exam or if you need to reschedule, you may call [facility] at [phone] between the hours of 8:00 am and 5:00 pm, Monday-Friday.   Your provider has requested  that you go to the basement level for lab work before leaving today. Press "B" on the elevator. The lab is located at the first door on the left as you exit the elevator.   _______________________________________________________  If your blood pressure at your visit was 140/90 or greater, please contact your primary care physician to follow up on this.  _______________________________________________________  If you are age 67 or older, your body mass index should be between 23-30. Your Body mass index is 19.05 kg/m. If this is out of the aforementioned range listed, please consider follow up with your Primary Care Provider.  If you are age 67 or younger, your body mass index should be between 19-25. Your Body mass index is 19.05 kg/m. If this is out of the aformentioned range listed, please consider follow up with your Primary Care Provider.   ________________________________________________________  The [facility] providers would like to encourage you to use [facility] to communicate with providers for non-urgent requests or questions.  Due to long hold times on the telephone, sending your provider a message by [facility] may be a faster and more efficient way to get a response.  Please allow 48 business hours for a response.  Please remember that this is for non-urgent requests.  _______________________________________________________ It was a pleasure to see you today!  Thank you for trusting me with your gastrointestinal care!

## 2023-07-25 LAB — IRON,TIBC AND FERRITIN PANEL
%SAT: 12 % — ABNORMAL LOW (ref 16–45)
Ferritin: 7 ng/mL — ABNORMAL LOW (ref 16–288)
Iron: 44 ug/dL — ABNORMAL LOW (ref 45–160)
TIBC: 377 ug/dL (ref 250–450)

## 2023-07-31 ENCOUNTER — Ambulatory Visit (HOSPITAL_COMMUNITY): Payer: Self-pay

## 2023-07-31 NOTE — Progress Notes (Addendum)
____________________________________________________________  Attending physician addendum:  Thank you for sending this case to me. I have reviewed the entire note and agree with the plan.  Unfortunate that we were not provided additional information from the referring provider regarding this patient's history of any liver disease workup or report from her prior CT scan.  Thank you for requesting that additional data to help with our clinical decisions.  Amada Jupiter, MD  ____________________________________________________________    Attending physician addendum-09/16/2023  Gunnar Fusi, thank you for recording the findings on outside imaging.  Nonspecific findings of the colon and distal small bowel had been evaluated endoscopically, at, as you point out.  Reported nonspecific liver lesions might not necessarily need further imaging if her LFTs normalize.  I agree that her markedly elevated LFTs initially were related to cholecystitis. Recent LFTs are nearly normalized, and there was no reported biliary ductal dilatation on postcholecystectomy CT scans, making choledocholithiasis significantly less likely.  My recommendation at this point is that we do stool cards for occult blood x 3 as well as CBC and iron studies and LFTs in about 6 weeks. Although they live in [place], the patient's daughter that I spoke to after the procedure said she would be happy to bring her mother to [facility] for this testing.   Ellwood Dense MD

## 2023-08-11 ENCOUNTER — Other Ambulatory Visit: Payer: Self-pay | Admitting: *Deleted

## 2023-08-11 DIAGNOSIS — D509 Iron deficiency anemia, unspecified: Secondary | ICD-10-CM

## 2023-08-12 ENCOUNTER — Telehealth: Payer: Self-pay | Admitting: Nurse Practitioner

## 2023-08-12 NOTE — Telephone Encounter (Signed)
Inbound call from patient's daughter stating she is returning phone call. Daughter requesting a call back, states an interpreter is not needed. Please advise, thank you.

## 2023-08-12 NOTE — Telephone Encounter (Signed)
Daughter returned call, nurse initially not available, although did call back to speak to the daughter. Daughter was informed we needed to be able to confirm if the patient is interested in having the EGD procedure done and if so, it will be done the same day as the already scheduled colonoscopy. Daughter informed the nurse that her mother does want to have both the EGD and the Colonoscopy. Schedule changed to include both the EGD and the colonoscopy procedures as per order. Daughter has the Suprep instructions and wanted to know if it would require anything different with the add on of the EGD. Informed the daughter that nothing would change and to make sure her mother followed the instructions given. Also informed the daughter about the Ferrous Sulfate ordered for the patient and informed her, the mother should take the medication 325 mg daily at bedtime until 5 days prior to the procedure. Also notified the daughter the medication can cause constipation and if it occurs, she can try 1-2 dulcolax daily at bedtime and if doesn't help, can try Miralax 1 capful mixed with 8 ounces of water daily. Daughter informed these products can be found OTC at any pharmacy. Labs will be due in 3 months, but she will be notified when that time comes. Notified the daughter to call if she has any questions. She understood and agreed. Labs in [place].

## 2023-08-13 ENCOUNTER — Ambulatory Visit (HOSPITAL_COMMUNITY): Payer: Self-pay

## 2023-08-15 ENCOUNTER — Telehealth: Payer: Self-pay | Admitting: Gastroenterology

## 2023-08-15 NOTE — Telephone Encounter (Signed)
Inbound call from [facility] on patient's daughter behalf requesting a call to the daughter to discuss of out pocket cost for patient's procedures. Please advise, thank you.

## 2023-09-12 ENCOUNTER — Ambulatory Visit (AMBULATORY_SURGERY_CENTER): Payer: Self-pay | Admitting: Gastroenterology

## 2023-09-12 ENCOUNTER — Encounter: Payer: Self-pay | Admitting: Gastroenterology

## 2023-09-12 VITALS — BP 102/67 | HR 84 | Temp 98.0°F | Resp 14 | Ht <= 58 in | Wt 91.0 lb

## 2023-09-12 DIAGNOSIS — D122 Benign neoplasm of ascending colon: Secondary | ICD-10-CM

## 2023-09-12 DIAGNOSIS — D509 Iron deficiency anemia, unspecified: Secondary | ICD-10-CM

## 2023-09-12 DIAGNOSIS — K648 Other hemorrhoids: Secondary | ICD-10-CM

## 2023-09-12 DIAGNOSIS — K2970 Gastritis, unspecified, without bleeding: Secondary | ICD-10-CM

## 2023-09-12 DIAGNOSIS — K2950 Unspecified chronic gastritis without bleeding: Secondary | ICD-10-CM

## 2023-09-12 DIAGNOSIS — B9681 Helicobacter pylori [H. pylori] as the cause of diseases classified elsewhere: Secondary | ICD-10-CM

## 2023-09-12 DIAGNOSIS — R933 Abnormal findings on diagnostic imaging of other parts of digestive tract: Secondary | ICD-10-CM

## 2023-09-12 DIAGNOSIS — D120 Benign neoplasm of cecum: Secondary | ICD-10-CM

## 2023-09-12 MED ORDER — SODIUM CHLORIDE 0.9 % IV SOLN: 500.0000 mL | Freq: Once | INTRAVENOUS | Status: DC

## 2023-09-12 NOTE — Patient Instructions (Addendum)
Folleto educativo proporcionado al paciente relacionado con hemorroides y plipos.  Reanudar la dieta anterior  Continuar con los medicamentos actuales.  A la espera de [employer].  USTED TUVO UN PROCEDIMIENTO ENDOSCPICO HOY EN EL [facility]:   Lea el informe del procedimiento que se le entreg para cualquier pregunta especfica sobre lo que se Dentist.  Si el informe del examen no responde a sus preguntas, por favor llame a su gastroenterlogo para aclararlo.  Si usted solicit que no se le den [employer] de lo que se Clinical cytogeneticist en su procedimiento al [employer] va a cuidar, entonces el informe del procedimiento se ha incluido en un sobre sellado para que usted lo revise despus cuando le sea ms conveniente.   LO QUE PUEDE ESPERAR: Algunas sensaciones de hinchazn en el abdomen.  Puede tener ms gases de lo normal.  El caminar puede ayudarle a eliminar el aire que se le puso en el tracto gastrointestinal durante el procedimiento y reducir la hinchazn.  Si le hicieron una endoscopia inferior (como una colonoscopia o una sigmoidoscopia flexible), podra notar manchas de sangre en las heces fecales o en el papel higinico.  Si se someti a una preparacin intestinal para su procedimiento, es posible que no tenga una evacuacin intestinal normal durante [employer].   Tenga en cuenta:  Es posible que note un poco de irritacin y congestin en la nariz o algn drenaje.  Esto es debido al oxgeno [employer] durante su procedimiento.  No hay que preocuparse y esto debe desaparecer ms o Regulatory affairs officer.   SNTOMAS PARA REPORTAR INMEDIATAMENTE:  Despus de una endoscopia inferior (colonoscopia o sigmoidoscopia flexible):  Cantidades excesivas de sangre en las heces fecales  Sensibilidad significativa o empeoramiento de los dolores abdominales   Hinchazn aguda del abdomen que antes no tena   Fiebre de 100F o ms   Despus de la endoscopia superior  (EGD)  Vmitos de Retail buyer o material como caf molido   Dolor en el pecho o dolor debajo de los omplatos que antes no tena   Dolor o dificultad persistente para tragar  Falta de aire que antes no tena   Fiebre de 100F o ms  Heces fecales negras y pegajosas   Para asuntos urgentes o de Associate Professor, puede comunicarse con un gastroenterlogo a cualquier hora llamando al [phone].  DIETA:  Recomendamos una comida pequea al principio, pero luego puede continuar con su dieta normal.  Tome muchos lquidos, Tax adviser las bebidas alcohlicas durante 24 horas.    ACTIVIDAD:  Debe planear tomarse las cosas con calma por el resto del da y no debe CONDUCIR ni usar maquinaria pesada Patent examiner (debido a los medicamentos de sedacin utilizados durante el examen).     SEGUIMIENTO: Nuestro personal llamar al nmero que aparece en su historial al siguiente da hbil de su procedimiento para ver cmo se siente y para responder cualquier pregunta o inquietud que pueda tener con respecto a la informacin que se le dio despus del procedimiento. Si no podemos contactarle, le dejaremos un mensaje.  Sin embargo, si se siente bien y no tiene English as a second language teacher, no es necesario que nos devuelva la llamada.  Asumiremos que ha regresado a sus actividades diarias normales sin incidentes. Si se le tomaron algunas biopsias, le contactaremos por telfono o por carta en las prximas 3 semanas.  Si no ha sabido [employer] biopsias en el transcurso de 3 semanas, por favor llmenos  al [phone].   FIRMAS/CONFIDENCIALIDAD: Usted y/o el acompaante que le cuide han firmado documentos que se ingresarn en su historial mdico electrnico.  Estas firmas atestiguan el hecho de que la informacin anterior

## 2023-09-12 NOTE — Op Note (Signed)
Lynn Phillips: Lynn Phillips Procedure Date: 09/12/2023 2:42 PM MRN: [id number] Endoscopist: Lynn Phillips L. Myrtie Neither , MD, [id number] Age: 62 Referring MD:  Date of Birth: [birth year] Gender: Female Account #: [id number] Procedure:                Upper GI endoscopy Indications:              Epigastric abdominal pain, Unexplained iron                            deficiency anemia                           clinical details in 07/24/23 office consult note Medicines:                Monitored Anesthesia Care Procedure:                Pre-Anesthesia Assessment:                           - Prior to the procedure, a History and Physical                            was performed, and patient medications and                            allergies were reviewed. The patient's tolerance of                            previous anesthesia was also reviewed. The risks                            and benefits of the procedure and the sedation                            options and risks were discussed with the patient.                            All questions were answered, and informed consent                            was obtained. Prior Anticoagulants: The patient has                            taken no anticoagulant or antiplatelet agents. ASA                            Grade Assessment: II - A patient with mild systemic                            disease. After reviewing the risks and benefits,                            the patient was deemed in satisfactory condition to  undergo the procedure.                           After obtaining informed consent, the endoscope was                            passed under direct vision. Throughout the                            procedure, the patient's blood pressure, pulse, and                            oxygen saturations were monitored continuously. The                            Olympus Scope SN [id number] was introduced  through the                            mouth, and advanced to the second part of duodenum.                            The upper GI endoscopy was accomplished without                            difficulty. The patient tolerated the procedure                            well. Scope In: Scope Out: Findings:                 The larynx was normal.                           The esophagus was normal.                           Diffuse atrophic mucosa was found in the entire                            examined stomach. Biopsies were taken with a cold                            forceps for histology. (Antrum and body in one                            pathology jar to rule out H. pylori)                           The exam of the stomach was otherwise normal.                           The cardia and gastric fundus were normal on                            retroflexion. (Hill grade 2)  Normal mucosa was found in the entire duodenum.                            Biopsies for histology were taken with a cold                            forceps for evaluation of celiac disease. Complications:            No immediate complications. Estimated Blood Loss:     Estimated blood loss was minimal. Impression:               - Normal larynx.                           - Normal esophagus.                           - Gastric mucosal atrophy. Biopsied.                           - Normal mucosa was found in the entire examined                            duodenum. Biopsied. Recommendation:           - Patient has a contact number available for                            emergencies. The signs and symptoms of potential                            delayed complications were discussed with the                            patient. Return to normal activities tomorrow.                            Written discharge instructions were provided to the                            patient.                            - Resume previous diet.                           - Continue present medications.                           - Await pathology results.                           - See the other procedure note for documentation of                            additional recommendations. Lynn Phillips L. Myrtie Neither, MD 09/12/2023 3:27:23 PM This report has been signed electronically.

## 2023-09-12 NOTE — Op Note (Addendum)
[facility] Patient Name: Lynn Phillips Procedure Date: 09/12/2023 2:41 PM MRN: [id number] Endoscopist: Sherilyn Cooter L. Myrtie Neither , MD, [id number] Age: 62 Referring MD:  Date of Birth: 1961/06/25 Gender: Female Account #: [id number] Procedure:                Colonoscopy Indications:              Unexplained iron deficiency anemia Medicines:                Monitored Anesthesia Care Procedure:                Pre-Anesthesia Assessment:                           - Prior to the procedure, a History and Physical                            was performed, and patient medications and                            allergies were reviewed. The patient's tolerance of                            previous anesthesia was also reviewed. The risks                            and benefits of the procedure and the sedation                            options and risks were discussed with the patient.                            All questions were answered, and informed consent                            was obtained. Prior Anticoagulants: The patient has                            taken no anticoagulant or antiplatelet agents. ASA                            Grade Assessment: II - A patient with mild systemic                            disease. After reviewing the risks and benefits,                            the patient was deemed in satisfactory condition to                            undergo the procedure.                           After obtaining informed consent, the colonoscope  was passed under direct vision. Throughout the                            procedure, the patient's blood pressure, pulse, and                            oxygen saturations were monitored continuously. The                            [id number] Colonoscope [id number] was introduced                            through the anus and advanced 20 cm into the the                            terminal ileum, with  additional identification of                            the appendiceal orifice and IC valve. Scope In: 3:06:46 PM Scope Out: 3:20:05 PM Scope Withdrawal Time: 0 hours 10 minutes 14 seconds  Total Procedure Duration: 0 hours 13 minutes 19 seconds  Findings:                 The perianal and digital rectal examinations were                            normal.                           The terminal ileum appeared normal.                           Two sessile polyps were found in the ascending                            colon and cecum. The polyps were diminutive in size                            (2 to 6 mm). These polyps were removed with a cold                            snare. Resection and retrieval were complete.                           Retroflexion in the rectum was not performed due to                            narrow anatomy.                           Internal hemorrhoids were found during anoscopy.                            The hemorrhoids were small and Grade I (internal  hemorrhoids that do not prolapse).                           The exam was otherwise without abnormality. Complications:            No immediate complications. Estimated Blood Loss:     Estimated blood loss was minimal. Impression:               - The examined portion of the ileum was normal.                           - Two diminutive polyps in the ascending colon and                            in the cecum, removed with a cold snare. Resected                            and retrieved.                           - Internal hemorrhoids.                           - The examination was otherwise normal. Recommendation:           - Patient has a contact number available for                            emergencies. The signs and symptoms of potential                            delayed complications were discussed with the                            patient. Return to normal activities  tomorrow.                            Written discharge instructions were provided to the                            patient.                           - Resume previous diet.                           - Continue present medications.                           - Repeat colonoscopy is recommended for                            surveillance. The colonoscopy date will be                            determined after pathology results from today's  exam become available for review.                           - Arrange stool for occult blood x 3                           Recheck CBC and iron studies in 6 weeks. (Results                            of above will determine need for small bowel video                            capsule study)                           LFTs checked at our office 07/24/2023 had nearly                            normalized after having been significantly elevated                            during an ED visit and subsequent primary care                            visit in June of this year. Unfortunately, report                            of that CT abdomen and pelvis done at an outside                            hospital were not sent to [place] along with the                            referral, nor were they sent after a subsequent                            records request.                           We will request the aid of this patient and their                            family to communicate with their primary care                            provider and asked them to send that CT report to                            [place]. There was reportedly an "enteritis". The                            clinical significance of that finding and its  questionable relation to the episode of abdominal                            pain after cholecystectomy with elevated LFTs is                            therefore of uncertain significance  at this point                            until more information is made available to [place]. Eiko Mcgowen L. Myrtie Neither, MD 09/12/2023 3:35:29 PM This report has been signed electronically.

## 2023-09-12 NOTE — Progress Notes (Unsigned)
History and Physical:  This patient presents for endoscopic testing for: Encounter Diagnoses  Name Primary?   Iron deficiency anemia, unspecified iron deficiency anemia type Yes   Abnormal CT scan, gastrointestinal tract     Clinical details in 07/24/23 office consult note. IDA.  Epigastric pain at outside hospital system earlier this year.  Reported CT scan abnormality of "enteritis".  Have not rec'd outside records despite record request. LFTs were elevated, though significantly improved (nearly normalized) on check at this office. Hemoglobin normalized when checked here, but ferritin  = 7.  Started on iron tablets. Patient is otherwise without complaints or active issues today. Seen with Spanish interpreter . Patient reports ongoing intermittent upper abdominal pain that is difficult to characterize.  Past Medical History: Past Medical History:  Diagnosis Date   Diabetes mellitus without complication (HCC)    Hyperlipidemia      Past Surgical History: Past Surgical History:  Procedure Laterality Date   ABDOMINAL HYSTERECTOMY  2015   CHOLECYSTECTOMY      Allergies: No Known Allergies  Outpatient Meds: Current Outpatient Medications  Medication Sig Dispense Refill   atorvastatin (LIPITOR) 80 MG tablet Take 80 mg by mouth daily.     ferrous sulfate 325 (65 FE) MG EC tablet Take 325 mg by mouth 3 (three) times daily with meals.     albuterol (VENTOLIN HFA) 108 (90 Base) MCG/ACT inhaler Inhale 2 puffs into the lungs every 6 (six) hours as needed for wheezing or shortness of breath. (Patient not taking: Reported on 09/12/2023)     polyethylene glycol powder (GLYCOLAX/MIRALAX) 17 GM/SCOOP powder Take 1 capsule by mouth daily. Take 1 cap mixed in water (Patient not taking: Reported on 09/12/2023)     STOOL SOFTENER 100 MG capsule Take 100 mg by mouth daily. (Patient not taking: Reported on 09/12/2023)     Current Facility-Administered Medications  Medication Dose Route Frequency  Provider Last Rate Last Admin   0.9 %  sodium chloride infusion  500 mL Intravenous Once Sherrilyn Rist, MD          ___________________________________________________________________ Objective   Exam:  BP 112/62   Pulse 83   Temp 98 F (36.7 C) (Skin)   Ht 4\' 10"  (1.473 m)   Wt 91 lb (41.3 kg)   SpO2 98%   BMI 19.02 kg/m   CV: regular , S1/S2 Resp: clear to auscultation bilaterally, normal RR and effort noted GI: soft, no tenderness, with active bowel sounds.   Assessment: Encounter Diagnoses  Name Primary?   Iron deficiency anemia, unspecified iron deficiency anemia type Yes   Abnormal CT scan, gastrointestinal tract      Plan: Colonoscopy EGD  The benefits and risks of the planned procedure were described in detail with the patient or (when appropriate) their health care proxy.  Risks were outlined as including, but not limited to, bleeding, infection, perforation, adverse medication reaction leading to cardiac or pulmonary decompensation, pancreatitis (if ERCP).  The limitation of incomplete mucosal visualization was also discussed.  No guarantees or warranties were given.  The patient is appropriate for an endoscopic procedure in the ambulatory setting.   - Amada Jupiter, MD

## 2023-09-12 NOTE — Progress Notes (Signed)
Called to room to assist during endoscopic procedure.  Patient ID and intended procedure confirmed with present staff. Received instructions for my participation in the procedure from the performing physician.  

## 2023-09-12 NOTE — Progress Notes (Unsigned)
Interpreter used today at the [facility], in PACU, for this pt.  Interpreter's name is-Mariel Ronie Spies

## 2023-09-12 NOTE — Progress Notes (Unsigned)
Pt's states no medical or surgical changes since previsit or office visit. 

## 2023-09-12 NOTE — Progress Notes (Unsigned)
Sedate, gd SR, tolerated procedure well, VSS, report to RN 

## 2023-09-15 ENCOUNTER — Telehealth: Payer: Self-pay

## 2023-09-15 NOTE — Telephone Encounter (Signed)
Daughter Lynn Phillips answered the phone. Reports her mother denies any pain or fever. No further .questions or concerns.  Follow up Call-     09/12/2023    2:09 PM  Call back number  Post procedure Call Back phone  # [phone]  Permission to leave phone message Yes     Patient questions:  Do you have a fever, pain , or abdominal swelling? No. Pain Score  0 *  Have you tolerated food without any problems? Yes.    Have you been able to return to your normal activities? Yes.    Do you have any questions about your discharge instructions: Diet   No. Medications  No. Follow up visit  No.  Do you have questions or concerns about your Care? No.  Actions: * If pain score is 4 or above: No action needed, pain <4.

## 2023-09-18 LAB — SURGICAL PATHOLOGY

## 2023-09-23 ENCOUNTER — Encounter: Payer: Self-pay | Admitting: Gastroenterology

## 2023-09-25 ENCOUNTER — Other Ambulatory Visit: Payer: Self-pay

## 2023-10-07 ENCOUNTER — Telehealth: Payer: Self-pay

## 2023-10-07 ENCOUNTER — Other Ambulatory Visit: Payer: Self-pay

## 2023-10-07 DIAGNOSIS — B9681 Helicobacter pylori [H. pylori] as the cause of diseases classified elsewhere: Secondary | ICD-10-CM

## 2023-10-07 MED ORDER — OMEPRAZOLE 20 MG PO CPDR: 20.0000 mg | DELAYED_RELEASE_CAPSULE | Freq: Two times a day (BID) | ORAL | 0 refills | Status: AC

## 2023-10-07 MED ORDER — TETRACYCLINE HCL 500 MG PO CAPS: 500.0000 mg | ORAL_CAPSULE | Freq: Four times a day (QID) | ORAL | 0 refills | Status: DC

## 2023-10-07 MED ORDER — BISMUTH SUBSALICYLATE 262 MG PO TABS: 524.0000 mg | ORAL_TABLET | Freq: Four times a day (QID) | ORAL | 0 refills | Status: AC

## 2023-10-07 MED ORDER — METRONIDAZOLE 250 MG PO TABS: 250.0000 mg | ORAL_TABLET | Freq: Four times a day (QID) | ORAL | 0 refills | Status: AC

## 2023-10-07 NOTE — Telephone Encounter (Signed)
 Received call from [facility] stating that they do not keep in stock the Tetracycline  and will not be able to fill this. Chart reviewed and noted that the following had been ordered. Tetracycline  500 mg four times daily x 14 days;  Disp# 56 tablets, RF zero  Pharmacist is requesting if an alternative can be prescribed.  Please review and advise.

## 2023-10-09 ENCOUNTER — Other Ambulatory Visit: Payer: Self-pay

## 2023-10-09 MED ORDER — DOXYCYCLINE HYCLATE 100 MG PO CAPS: 100.0000 mg | ORAL_CAPSULE | Freq: Two times a day (BID) | ORAL | 0 refills | Status: AC

## 2023-10-09 NOTE — Telephone Encounter (Signed)
 Spoke with [facility] & they are unable to order medication. Alternative has been sent in & daughter made aware.

## 2023-10-09 NOTE — Telephone Encounter (Signed)
 Since some pharmacies do not stock tetracycline , please contact them and ask if they can order it for this patient.  Tetracycline  is the preferred part of this treatment regimen.  If they are unable to order it or it is otherwise unavailable, then a substitute would be doxycycline  100 mg twice daily for the same duration (though it may be less effective than tetracycline ).  VEAR Brand MD

## 2023-10-21 ENCOUNTER — Other Ambulatory Visit: Payer: Self-pay | Admitting: *Deleted

## 2023-10-21 ENCOUNTER — Telehealth: Payer: Self-pay | Admitting: *Deleted

## 2023-10-21 DIAGNOSIS — D509 Iron deficiency anemia, unspecified: Secondary | ICD-10-CM

## 2023-10-21 DIAGNOSIS — D649 Anemia, unspecified: Secondary | ICD-10-CM

## 2023-10-21 DIAGNOSIS — R7989 Other specified abnormal findings of blood chemistry: Secondary | ICD-10-CM

## 2023-10-21 NOTE — Telephone Encounter (Signed)
-----   Message from Vina Dasen sent at 10/20/2023  4:47 PM EST ----- Merlynn,  Please contact patient. Following the EGD and Colonoscopy in December Dr. Legrand recommended Hemoccult cards x 3 and CBC, ferritin, TIBC and LFTs in about 6 weeks.  It is now time to get those done if you could order. They live in [place] but daughter told Dr. Legrand she is happy to bring patient to [facility] for these studies. Thanks

## 2023-10-21 NOTE — Telephone Encounter (Signed)
 Called patient to inform of Dr. Legrand' recommendations in reference to labs that are due this week or next week if possible; and Hemoccult stool cards that will be mailed to her home. Interpreter assisted in leaving the message via VM. Patient does not have MyChart and unable to send patient message.

## 2023-10-31 ENCOUNTER — Telehealth: Payer: Self-pay

## 2023-10-31 NOTE — Telephone Encounter (Signed)
Called patient using translator- patient did not answer and left voicemail.

## 2024-08-24 ENCOUNTER — Other Ambulatory Visit: Payer: Self-pay | Admitting: Obstetrics and Gynecology

## 2024-08-24 DIAGNOSIS — Z1231 Encounter for screening mammogram for malignant neoplasm of breast: Secondary | ICD-10-CM

## 2024-09-08 ENCOUNTER — Ambulatory Visit (HOSPITAL_BASED_OUTPATIENT_CLINIC_OR_DEPARTMENT_OTHER)
Admission: RE | Admit: 2024-09-08 | Discharge: 2024-09-08 | Disposition: A | Payer: Self-pay | Source: Ambulatory Visit | Attending: Obstetrics and Gynecology | Admitting: Obstetrics and Gynecology

## 2024-09-08 ENCOUNTER — Inpatient Hospital Stay: Payer: Self-pay | Admitting: Hematology and Oncology

## 2024-09-08 VITALS — BP 125/65 | Wt 97.0 lb

## 2024-09-08 DIAGNOSIS — Z1231 Encounter for screening mammogram for malignant neoplasm of breast: Secondary | ICD-10-CM

## 2024-09-08 NOTE — Progress Notes (Signed)
 Ms. Lynn Phillips is a 63 y.o. female who presents to [facility] clinic today with no complaints.    Pap Smear: Pap not smear completed today due to hysterectomy.   Physical exam: Breasts Breasts symmetrical. No skin abnormalities bilateral breasts. No nipple retraction bilateral breasts. No nipple discharge bilateral breasts. No lymphadenopathy. No lumps palpated bilateral breasts.       Pelvic/Bimanual Pap is not indicated today    Smoking History: Patient has never smoked and was not referred to quit line.    Patient Navigation: Patient education provided. Access to services provided for patient through BCCCP program. Lynn Phillips interpreter provided. No transportation provided   Colorectal Cancer Screening: Per patient has never had colonoscopy completed No complaints today.    Breast and Cervical Cancer Risk Assessment: Patient does not have family history of breast cancer, known genetic mutations, or radiation treatment to the chest before age 35. Patient does not have history of cervical dysplasia, immunocompromised, or DES exposure in-utero.  Risk Scores as of Encounter on 09/08/2024     Lynn Phillips           5-year 2.03%   Lifetime 8.1%   This patient is [place] but has no documented birth country, so the [place] model used data from [place] patients to calculate their risk score. Document a birth country in the Demographics activity for a more accurate score.         Last calculated by Lynn, Ansyi Phillips, CMA on 09/08/2024 at 11:34 AM        A: BCCCP exam without pap smear No complaints with benign exam.  P: Referred patient to the [facility] for a screening mammogram. Appointment scheduled 09/08/2024.  Lynn Phillips LABOR, NP 09/08/2024 12:15 PM

## 2024-09-09 NOTE — Progress Notes (Signed)
 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   Family History of Breast Cancer:  Appointment for Mammogram: [facility] [place] 09/08/24 at 12:00p    Complaints: No breast complaints

## 2024-09-18 ENCOUNTER — Ambulatory Visit: Payer: Self-pay | Admitting: Obstetrics & Gynecology
